# Patient Record
Sex: Male | Born: 1995 | Race: White | Hispanic: No | Marital: Married | Smoking: Never smoker
Health system: Southern US, Community
[De-identification: ages and names within clinical notes are randomized; demographics above are authoritative.]

## PROBLEM LIST (undated history)

## (undated) DIAGNOSIS — K219 Gastro-esophageal reflux disease without esophagitis: Secondary | ICD-10-CM

## (undated) HISTORY — PX: APPENDECTOMY: SHX54

## (undated) HISTORY — PX: HAND SURGERY: SHX662

## (undated) HISTORY — DX: Gastro-esophageal reflux disease without esophagitis: K21.9

---
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
## 2013-06-01 ENCOUNTER — Emergency Department (HOSPITAL_COMMUNITY): Payer: BC Managed Care – PPO

## 2013-06-01 ENCOUNTER — Encounter (HOSPITAL_COMMUNITY): Payer: Self-pay | Admitting: Emergency Medicine

## 2013-06-01 ENCOUNTER — Emergency Department (HOSPITAL_COMMUNITY)
Admission: EM | Admit: 2013-06-01 | Discharge: 2013-06-01 | Disposition: A | Discharge status: Home / Self Care | Payer: BC Managed Care – PPO | Attending: Emergency Medicine | Admitting: Emergency Medicine

## 2013-06-01 DIAGNOSIS — Z23 Encounter for immunization: Secondary | ICD-10-CM | POA: Insufficient documentation

## 2013-06-01 DIAGNOSIS — Y92410 Unspecified street and highway as the place of occurrence of the external cause: Secondary | ICD-10-CM | POA: Insufficient documentation

## 2013-06-01 DIAGNOSIS — S0990XA Unspecified injury of head, initial encounter: Secondary | ICD-10-CM

## 2013-06-01 DIAGNOSIS — S8392XA Sprain of unspecified site of left knee, initial encounter: Secondary | ICD-10-CM

## 2013-06-01 DIAGNOSIS — Y9389 Activity, other specified: Secondary | ICD-10-CM | POA: Insufficient documentation

## 2013-06-01 DIAGNOSIS — S060X9A Concussion with loss of consciousness of unspecified duration, initial encounter: Secondary | ICD-10-CM | POA: Insufficient documentation

## 2013-06-01 DIAGNOSIS — IMO0002 Reserved for concepts with insufficient information to code with codable children: Secondary | ICD-10-CM | POA: Insufficient documentation

## 2013-06-01 MED ORDER — KETOROLAC TROMETHAMINE 15 MG/ML IJ SOLN
15.0000 mg | Freq: Once | INTRAMUSCULAR | Status: AC
  Administered 2013-06-01: 15 mg via INTRAVENOUS
  Filled 2013-06-01: qty 1

## 2013-06-01 MED ORDER — ONDANSETRON HCL 4 MG/2ML IJ SOLN
4.0000 mg | Freq: Once | INTRAMUSCULAR | Status: AC
  Administered 2013-06-01: 4 mg via INTRAVENOUS
  Filled 2013-06-01: qty 2

## 2013-06-01 MED ORDER — TETANUS-DIPHTH-ACELL PERTUSSIS 5-2.5-18.5 LF-MCG/0.5 IM SUSP
0.5000 mL | Freq: Once | INTRAMUSCULAR | Status: AC
  Administered 2013-06-01: 0.5 mL via INTRAMUSCULAR
  Filled 2013-06-01: qty 0.5
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
## 2014-06-11 ENCOUNTER — Other Ambulatory Visit: Payer: Self-pay | Admitting: Occupational Medicine

## 2014-06-11 ENCOUNTER — Ambulatory Visit
Admission: RE | Admit: 2014-06-11 | Discharge: 2014-06-11 | Disposition: A | Discharge status: Home / Self Care | Payer: No Typology Code available for payment source | Source: Ambulatory Visit | Attending: Occupational Medicine | Admitting: Occupational Medicine

## 2014-06-11 DIAGNOSIS — Z021 Encounter for pre-employment examination: Secondary | ICD-10-CM

## 2018-03-08 ENCOUNTER — Emergency Department (HOSPITAL_COMMUNITY)
Admission: EM | Admit: 2018-03-08 | Discharge: 2018-03-08 | Disposition: A | Discharge status: Home / Self Care | Payer: 59 | Attending: Emergency Medicine | Admitting: Emergency Medicine

## 2018-03-08 ENCOUNTER — Emergency Department (HOSPITAL_COMMUNITY): Payer: 59

## 2018-03-08 ENCOUNTER — Other Ambulatory Visit: Payer: Self-pay

## 2018-03-08 ENCOUNTER — Encounter (HOSPITAL_COMMUNITY): Payer: Self-pay | Admitting: *Deleted

## 2018-03-08 DIAGNOSIS — W312XXA Contact with powered woodworking and forming machines, initial encounter: Secondary | ICD-10-CM | POA: Insufficient documentation

## 2018-03-08 DIAGNOSIS — Y9389 Activity, other specified: Secondary | ICD-10-CM | POA: Diagnosis not present

## 2018-03-08 DIAGNOSIS — Z23 Encounter for immunization: Secondary | ICD-10-CM | POA: Diagnosis not present

## 2018-03-08 DIAGNOSIS — Y929 Unspecified place or not applicable: Secondary | ICD-10-CM | POA: Insufficient documentation

## 2018-03-08 DIAGNOSIS — Y999 Unspecified external cause status: Secondary | ICD-10-CM | POA: Insufficient documentation

## 2018-03-08 DIAGNOSIS — S61012A Laceration without foreign body of left thumb without damage to nail, initial encounter: Secondary | ICD-10-CM | POA: Insufficient documentation

## 2018-03-08 MED ORDER — TETANUS-DIPHTH-ACELL PERTUSSIS 5-2.5-18.5 LF-MCG/0.5 IM SUSP
0.5000 mL | Freq: Once | INTRAMUSCULAR | Status: AC
  Administered 2018-03-08: 0.5 mL via INTRAMUSCULAR
  Filled 2018-03-08: qty 0.5

## 2018-03-08 MED ORDER — CEPHALEXIN 500 MG PO CAPS: 500.0000 mg | ORAL_CAPSULE | Freq: Four times a day (QID) | ORAL | 0 refills | Status: DC

## 2018-03-08 MED ORDER — ACETAMINOPHEN 325 MG PO TABS
650.0000 mg | ORAL_TABLET | Freq: Once | ORAL | Status: AC
  Administered 2018-03-08: 650 mg via ORAL
  Filled 2018-03-08: qty 2

## 2018-03-08 MED ORDER — LIDOCAINE HCL (PF) 1 % IJ SOLN
10.0000 mL | Freq: Once | INTRAMUSCULAR | Status: AC
  Administered 2018-03-08: 5 mL
  Filled 2018-03-08: qty 10

## 2018-03-08 NOTE — ED Notes (Signed)
ED Provider at bedside. 

## 2018-03-08 NOTE — ED Provider Notes (Signed)
[facility] EMERGENCY DEPARTMENT Provider Note   CSN: [id number] Arrival date & time: 03/08/18  2115     History   Chief Complaint Chief Complaint  Patient presents with  . Laceration    HPI Austin Porter is a 22 y.o. male without significant past medical hx who presents to the ED s/p L thumb laceration which occurred approximately 30 minutes PTA. Patient states he was using a table saw when he slipped and accidentally cut the palmar aspect of the L thumb. States areas is painful, rates pain a 6/10 in severity, worse with palpation. No alleviating factors. No meds PTA. No intervention PTA other than holding pressure. He is able to move the thumb. Denies numbness, tingling, weakness, or other areas of injury. Unsure of last tetanus, but thinks it was 6 years ago. Patient is R hand dominant.   HPI  History reviewed. No pertinent past medical history.  There are no active problems to display for this patient.   History reviewed. No pertinent surgical history.      Home Medications    Prior to Admission medications   Not on File    Family History No family history on file.  Social History Social History   Tobacco Use  . Smoking status: Never Smoker  Substance Use Topics  . Alcohol use: No  . Drug use: Not on file     Allergies   Patient has no known allergies.   Review of Systems Review of Systems  Constitutional: Negative for chills and fever.  Skin: Positive for wound.  Neurological: Negative for weakness and numbness.     Physical Exam Updated Vital Signs BP (!) 146/99   Pulse 87   Temp 98.3 F (36.8 C)   Resp 20   SpO2 100%   Physical Exam  Constitutional: He appears well-developed and well-nourished. No distress.  HENT:  Head: Normocephalic and atraumatic.  Eyes: Conjunctivae are normal. Right eye exhibits no discharge. Left eye exhibits no discharge.  Cardiovascular:  Pulses:      Radial pulses are 2+ on the right  side, and 2+ on the left side.  Musculoskeletal:  Full ROM to bilateral wrists and all digits including MCPs and interphalangeal joints. Tender over laceration, no other areas of tenderness.   Neurological: He is alert.  Clear speech. Sensation grossly intact to bilateral upper extremities. 5/5 symmetric grip strength. Able to perform OK sign, thumbs up, and cross 2nd/3rd digits.   Skin: Capillary refill takes less than 2 seconds.  There is a 3cm avulsion type laceration to the palmar aspect of the distal phalanx of the L thumb. There is mild active bleeding, no obvious foreign body. Does not extend past the joint line or into the nailbed.   Psychiatric: He has a normal mood and affect. His behavior is normal. Thought content normal.  Nursing note and vitals reviewed.      ED Treatments / Results  Labs (all labs ordered are listed, but only abnormal results are displayed) Labs Reviewed - No data to display  EKG None  Radiology No results found.  Procedures .Marland KitchenLaceration Repair Date/Time: 03/08/2018 11:22 PM Performed by: Cherly Anderson, PA-C Authorized by: Cherly Anderson, PA-C   Consent:    Consent obtained:  Verbal   Consent given by:  Patient   Risks discussed:  Infection, pain, retained foreign body, vascular damage, tendon damage, poor cosmetic result, poor wound healing, nerve damage and need for additional repair   Alternatives discussed:  No treatment Anesthesia (see MAR for exact dosages):    Anesthesia method:  Nerve block   Block location:  Digital, L thumb   Block needle gauge:  27 G   Block anesthetic:  Lidocaine 1% w/o epi   Block outcome:  Anesthesia achieved Laceration details:    Location:  Finger   Finger location:  L thumb   Length (cm):  3 Pre-procedure details:    Preparation:  Patient was prepped and draped in usual sterile fashion Exploration:    Hemostasis achieved with:  Direct pressure   Wound exploration: wound explored through  full range of motion and entire depth of wound probed and visualized     Contaminated: no   Treatment:    Area cleansed with:  Betadine   Amount of cleaning:  Standard   Irrigation solution:  Sterile water   Irrigation volume:  1L   Irrigation method:  Pressure wash Skin repair:    Repair method:  Sutures   Suture size:  4-0   Suture material:  Nylon   Suture technique:  Simple interrupted   Number of sutures:  6 Approximation:    Laceration repair approximation: Close in some areas, remains with avulsion centrally- not ammenable to approximation. Post-procedure details:    Dressing:  Antibiotic ointment and non-adherent dressing   Patient tolerance of procedure:  Tolerated well, no immediate complications   (including critical care time)  Medications Ordered in ED Medications  acetaminophen (TYLENOL) tablet 650 mg (650 mg Oral Given 03/08/18 2202)  Tdap (BOOSTRIX) injection 0.5 mL (0.5 mLs Intramuscular Given 03/08/18 2259)  lidocaine (PF) (XYLOCAINE) 1 % injection 10 mL (5 mLs Infiltration Given by Other 03/08/18 2301)     Initial Impression / Assessment and Plan / ED Course  I have reviewed the triage vital signs and the nursing notes.  Pertinent labs & imaging results that were available during my care of the patient were reviewed by me and considered in my medical decision making (see chart for details).   Patient presents to the emergency department with left thumb avulsion skin laceration.  X-ray obtained negative for fracture or dislocations.  Neurovascularly intact distally.  Digital nerve block performed. Pressure irrigation performed. Wound explored and base of wound visualized in a bloodless field without evidence of foreign body.  Laceration repaired per procedure note above.  Able to approximate each end of the laceration, however the central area is a tissue avulsion injury which could not be reapproximated.  A total of 6 sutures were placed.  Tdap updated.  Will place  on Keflex for infection prophylaxis.  Discussed suture home care with patient and answered questions. Patient to follow-up for wound check and suture removal in 7 days; they are to return to the ED sooner for signs of infection. I discussed results, treatment plan, need for follow-up, and return precautions with the patient. Provided opportunity for questions, patient confirmed understanding and is in agreement with plan.   Findings and plan of care discussed with supervising physician Dr. Juleen ChinaKohut who is in agreement.  Final Clinical Impressions(s) / ED Diagnoses   Final diagnoses:  Laceration of left thumb without foreign body without damage to nail, initial encounter    ED Discharge Orders        Ordered    cephALEXin (KEFLEX) 500 MG capsule  4 times daily     03/08/18 2326       Ondrea Dow, JeannetteSamantha R, PA-C 03/09/18 0144    Raeford RazorKohut, Stephen, MD 03/11/18 [phone]

## 2018-03-08 NOTE — Discharge Instructions (Addendum)
You were seen in the emergency department today for a wound to your left thumb.  6 stitches were placed into the wound.  You need to keep this area clean and dry for the next 24 hours, after 24 hours you may get the area wet but do not soak it, please avoid getting it wet as much as possible.  Please keep this area covered.  We have given you a prescription for Keflex, an antibiotic, to help prevent infection. Please take all of your antibiotics until finished. You may develop abdominal discomfort or diarrhea from the antibiotic.  You may help offset this with probiotics which you can buy at the store (ask your pharmacist if unable to find) or get probiotics in the form of eating yogurt. Do not eat or take the probiotics until 2 hours after your antibiotic. If you are unable to tolerate these side effects follow-up with your primary care provider or return to the emergency department.   If you begin to experience any blistering, rashes, swelling, or difficulty breathing seek medical care for evaluation of potentially more serious side effects.   Please be aware that this medication may interact with other medications you are taking, please be sure to discuss your medication list with your pharmacist.     The stitches will need to be removed in 7 days, you may return to the ER, go to urgent care, or see your primary care provider.  Please return to the ER sooner for new or worsening symptoms including but not limited to fever, spreading redness, drainage from the wound that is puslike, or any other concerns that you may have.

## 2018-03-08 NOTE — ED Triage Notes (Addendum)
Pt has laceration to L thumb from a table saw. Tetanus 6 years ago

## 2018-10-27 ENCOUNTER — Emergency Department (HOSPITAL_COMMUNITY)
Admission: EM | Admit: 2018-10-27 | Discharge: 2018-10-27 | Disposition: A | Discharge status: Home / Self Care | Payer: 59 | Attending: Emergency Medicine | Admitting: Emergency Medicine

## 2018-10-27 ENCOUNTER — Other Ambulatory Visit: Payer: Self-pay

## 2018-10-27 ENCOUNTER — Encounter (HOSPITAL_COMMUNITY): Payer: Self-pay

## 2018-10-27 DIAGNOSIS — R1033 Periumbilical pain: Secondary | ICD-10-CM | POA: Insufficient documentation

## 2018-10-27 DIAGNOSIS — R109 Unspecified abdominal pain: Secondary | ICD-10-CM

## 2018-10-27 LAB — COMPREHENSIVE METABOLIC PANEL
ALK PHOS: 59 U/L (ref 38–126)
ALT: 26 U/L (ref 0–44)
AST: 31 U/L (ref 15–41)
Albumin: 4.7 g/dL (ref 3.5–5.0)
Anion gap: 9 (ref 5–15)
BILIRUBIN TOTAL: 0.9 mg/dL (ref 0.3–1.2)
BUN: 14 mg/dL (ref 6–20)
CHLORIDE: 101 mmol/L (ref 98–111)
CO2: 25 mmol/L (ref 22–32)
CREATININE: 1.03 mg/dL (ref 0.61–1.24)
Calcium: 10 mg/dL (ref 8.9–10.3)
Glucose, Bld: 99 mg/dL (ref 70–99)
Potassium: 4.8 mmol/L (ref 3.5–5.1)
Sodium: 135 mmol/L (ref 135–145)
TOTAL PROTEIN: 7.9 g/dL (ref 6.5–8.1)

## 2018-10-27 LAB — URINALYSIS, ROUTINE W REFLEX MICROSCOPIC
BILIRUBIN URINE: NEGATIVE
Glucose, UA: NEGATIVE mg/dL
Hgb urine dipstick: NEGATIVE
Leukocytes,Ua: NEGATIVE
Nitrite: NEGATIVE
Protein, ur: NEGATIVE mg/dL
Specific Gravity, Urine: 1.015 (ref 1.005–1.030)
pH: 6 (ref 5.0–8.0)

## 2018-10-27 LAB — CBC WITH DIFFERENTIAL/PLATELET
Abs Immature Granulocytes: 0.05 10*3/uL (ref 0.00–0.07)
BASOS ABS: 0 10*3/uL (ref 0.0–0.1)
Basophils Relative: 0 %
Eosinophils Absolute: 0 10*3/uL (ref 0.0–0.5)
Eosinophils Relative: 0 %
HCT: 48.4 % (ref 39.0–52.0)
HEMOGLOBIN: 15.6 g/dL (ref 13.0–17.0)
Immature Granulocytes: 0 %
LYMPHS ABS: 1 10*3/uL (ref 0.7–4.0)
Lymphocytes Relative: 9 %
MCH: 26.7 pg (ref 26.0–34.0)
MCHC: 32.2 g/dL (ref 30.0–36.0)
MCV: 82.9 fL (ref 80.0–100.0)
Monocytes Absolute: 0.5 10*3/uL (ref 0.1–1.0)
Monocytes Relative: 4 %
Neutro Abs: 10.2 10*3/uL — ABNORMAL HIGH (ref 1.7–7.7)
Neutrophils Relative %: 87 %
Platelets: 233 10*3/uL (ref 150–400)
RBC: 5.84 MIL/uL — AB (ref 4.22–5.81)
RDW: 12.1 % (ref 11.5–15.5)
WBC: 11.8 10*3/uL — AB (ref 4.0–10.5)
nRBC: 0 % (ref 0.0–0.2)

## 2018-10-27 LAB — LIPASE, BLOOD: Lipase: 34 U/L (ref 11–51)

## 2018-10-27 NOTE — ED Provider Notes (Signed)
[facility] EMERGENCY DEPARTMENT Provider Note   CSN: [id number] Arrival date & time: 10/27/18  1521    History   Chief Complaint Chief Complaint  Patient presents with  . Abdominal Pain    HPI Austin Porter is a 23 y.o. male.     23 year old male with history of appendectomy who presents with abdominal pain.  This morning, he began having sharp, stabbing, intermittent periumbilical abdominal pain.  The pain is nonradiating and comes and goes randomly.  He initially went to work because he otherwise felt okay but while at work the intermittent pain continued and he eventually became doubled over in pain.  His father, with whom he works, told him to be evaluated.  Currently the pain has resolved and he feels okay.  He has been drinking fluids throughout the day but states that he has not been hungry and has not eaten anything.  He has had no associated nausea, vomiting, diarrhea, or constipation.  Last bowel movement was last night and it was normal.  He denies any testicular pain or swelling.  No fevers or URI symptoms.  He took some Rolaids this morning, no other therapies tried. No alcohol or drug use.  The history is provided by the patient.  Abdominal Pain    History reviewed. No pertinent past medical history.  There are no active problems to display for this patient.   Past Surgical History:  Procedure Laterality Date  . APPENDECTOMY          Home Medications    Prior to Admission medications   Medication Sig Start Date End Date Taking? Authorizing Provider  cephALEXin (KEFLEX) 500 MG capsule Take 1 capsule (500 mg total) by mouth 4 (four) times daily. 03/08/18   Petrucelli, Pleas Koch, PA-C    Family History History reviewed. No pertinent family history.  Social History Social History   Tobacco Use  . Smoking status: Never Smoker  . Smokeless tobacco: Never Used  Substance Use Topics  . Alcohol use: No  . Drug use: Never      Allergies   Patient has no known allergies.   Review of Systems Review of Systems  Gastrointestinal: Positive for abdominal pain.   All other systems reviewed and are negative except that which was mentioned in HPI   Physical Exam Updated Vital Signs BP 128/68   Pulse 71   Temp 98.7 F (37.1 C) (Oral)   Resp 16   Ht 5\' 11"  (1.803 m)   Wt 86.2 kg   SpO2 99%   BMI 26.50 kg/m   Physical Exam Vitals signs and nursing note reviewed.  Constitutional:      General: He is not in acute distress.    Appearance: He is well-developed.  HENT:     Head: Normocephalic and atraumatic.     Mouth/Throat:     Mouth: Mucous membranes are moist.     Pharynx: Oropharynx is clear.  Eyes:     Conjunctiva/sclera: Conjunctivae normal.     Pupils: Pupils are equal, round, and reactive to light.  Neck:     Musculoskeletal: Neck supple.  Cardiovascular:     Rate and Rhythm: Normal rate and regular rhythm.     Heart sounds: Normal heart sounds. No murmur.  Pulmonary:     Effort: Pulmonary effort is normal.     Breath sounds: Normal breath sounds.  Abdominal:     General: Bowel sounds are normal. There is no distension.  Palpations: Abdomen is soft.     Tenderness: There is abdominal tenderness in the epigastric area. Negative signs include Murphy's sign.     Comments: Very mild tenderness to deep palpation of epigastrium   Skin:    General: Skin is warm and dry.  Neurological:     Mental Status: He is alert and oriented to person, place, and time.     Comments: Fluent speech  Psychiatric:        Judgment: Judgment normal.      ED Treatments / Results  Labs (all labs ordered are listed, but only abnormal results are displayed) Labs Reviewed  CBC WITH DIFFERENTIAL/PLATELET - Abnormal; Notable for the following components:      Result Value   WBC 11.8 (*)    RBC 5.84 (*)    Neutro Abs 10.2 (*)    All other components within normal limits  URINALYSIS, ROUTINE W REFLEX  MICROSCOPIC - Abnormal; Notable for the following components:   Color, Urine YELLOW (*)    APPearance CLEAR (*)    Ketones, ur TRACE (*)    All other components within normal limits  COMPREHENSIVE METABOLIC PANEL  LIPASE, BLOOD    EKG None  Radiology No results found.  Procedures Procedures (including critical care time)  Medications Ordered in ED Medications - No data to display   Initial Impression / Assessment and Plan / ED Course  I have reviewed the triage vital signs and the nursing notes.  Pertinent labs & imaging results that were available during my care of the patient were reviewed by me and considered in my medical decision making (see chart for details).       Well-appearing on exam, normal vital signs.  Indicates that pain was central, just above his umbilicus.  Very mild tenderness in midepigastrium to deep palpation but denies any pain currently.  His lab work today is reassuring with normal UA, no evidence of blood or infection to suggest renal stones or pyelonephritis.  CMP and CBC reassuring.  With normal LFTs and lipase, I highly doubt gallbladder pathology or pancreatitis. He notes eating spicy food last night, gastritis is possible. He has tolerated PO here and has had no reoccurrence of pain.  I discussed supportive measures including over-the-counter medications to try if his symptoms return and I extensively reviewed return precautions.  He voiced understanding.  Final Clinical Impressions(s) / ED Diagnoses   Final diagnoses:  Abdominal pain, unspecified abdominal location    ED Discharge Orders    None       Little, Ambrose Finland, MD 10/27/18 1829

## 2018-10-27 NOTE — ED Notes (Signed)
Patient verbalizes understanding of discharge instructions. Opportunity for questioning and answers were provided. Armband removed by staff, pt discharged from ED.  

## 2018-10-27 NOTE — ED Notes (Signed)
Pt passed PO/fluid challenge. No complaints.

## 2018-10-27 NOTE — ED Triage Notes (Signed)
Pt from work w/ a c/o central abd pain. The pain is intermittent and a sharp/stabby pain. No N/V/D and light headedness. No LOC. Pt has had his appendix removed. Last BM was last night. No fever. No hematuria or dysuria.

## 2019-05-28 ENCOUNTER — Other Ambulatory Visit: Payer: Self-pay | Admitting: Orthopaedic Surgery

## 2019-05-28 DIAGNOSIS — M79641 Pain in right hand: Secondary | ICD-10-CM

## 2019-05-31 ENCOUNTER — Other Ambulatory Visit: Payer: 59

## 2019-09-21 ENCOUNTER — Ambulatory Visit (INDEPENDENT_AMBULATORY_CARE_PROVIDER_SITE_OTHER): Payer: 59

## 2019-09-21 ENCOUNTER — Ambulatory Visit (HOSPITAL_COMMUNITY)
Admission: EM | Admit: 2019-09-21 | Discharge: 2019-09-21 | Disposition: A | Discharge status: Home / Self Care | Payer: 59 | Attending: Emergency Medicine | Admitting: Emergency Medicine

## 2019-09-21 ENCOUNTER — Other Ambulatory Visit: Payer: Self-pay

## 2019-09-21 ENCOUNTER — Encounter (HOSPITAL_COMMUNITY): Payer: Self-pay | Admitting: *Deleted

## 2019-09-21 DIAGNOSIS — S99922A Unspecified injury of left foot, initial encounter: Secondary | ICD-10-CM

## 2019-09-21 MED ORDER — IBUPROFEN 800 MG PO TABS: 800.0000 mg | ORAL_TABLET | Freq: Three times a day (TID) | ORAL | 0 refills | Status: DC | PRN
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
## 2019-09-21 NOTE — Discharge Instructions (Signed)
Your xray is normal today which is reassuring.  You obviously injured your foot with such heavy weight to it.  Ice, elevation and ibuprofen are essential to help with pain, especially over the next 48 hours.  If significant pain swelling, numbness tingling or change in sensation please go to the ER.
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
## 2019-09-21 NOTE — ED Notes (Signed)
Ice pack provided.  Pt declines elevation of LLE due to increased pain with elevation.

## 2020-04-15 ENCOUNTER — Other Ambulatory Visit: Payer: Self-pay

## 2020-04-15 ENCOUNTER — Ambulatory Visit
Admission: RE | Admit: 2020-04-15 | Discharge: 2020-04-15 | Disposition: A | Discharge status: Home / Self Care | Payer: No Typology Code available for payment source | Source: Ambulatory Visit | Attending: Nurse Practitioner | Admitting: Nurse Practitioner

## 2020-04-15 ENCOUNTER — Other Ambulatory Visit: Payer: Self-pay | Admitting: Nurse Practitioner

## 2020-04-15 DIAGNOSIS — Z0000 Encounter for general adult medical examination without abnormal findings: Secondary | ICD-10-CM
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
## 2023-03-29 ENCOUNTER — Encounter (HOSPITAL_COMMUNITY): Payer: Self-pay

## 2023-03-29 ENCOUNTER — Ambulatory Visit (HOSPITAL_COMMUNITY)
Admission: EM | Admit: 2023-03-29 | Discharge: 2023-03-29 | Disposition: A | Discharge status: Home / Self Care | Payer: Self-pay

## 2023-03-29 ENCOUNTER — Emergency Department (HOSPITAL_COMMUNITY)
Admission: EM | Admit: 2023-03-29 | Discharge: 2023-03-29 | Disposition: A | Discharge status: Home / Self Care | Payer: No Typology Code available for payment source | Attending: Emergency Medicine | Admitting: Emergency Medicine

## 2023-03-29 ENCOUNTER — Emergency Department (HOSPITAL_COMMUNITY): Payer: No Typology Code available for payment source

## 2023-03-29 ENCOUNTER — Other Ambulatory Visit: Payer: Self-pay

## 2023-03-29 DIAGNOSIS — S59902A Unspecified injury of left elbow, initial encounter: Secondary | ICD-10-CM | POA: Diagnosis present

## 2023-03-29 DIAGNOSIS — T07XXXA Unspecified multiple injuries, initial encounter: Secondary | ICD-10-CM

## 2023-03-29 DIAGNOSIS — Y92410 Unspecified street and highway as the place of occurrence of the external cause: Secondary | ICD-10-CM | POA: Insufficient documentation

## 2023-03-29 DIAGNOSIS — S0990XA Unspecified injury of head, initial encounter: Secondary | ICD-10-CM | POA: Insufficient documentation

## 2023-03-29 DIAGNOSIS — Z23 Encounter for immunization: Secondary | ICD-10-CM | POA: Diagnosis not present

## 2023-03-29 DIAGNOSIS — S51012A Laceration without foreign body of left elbow, initial encounter: Secondary | ICD-10-CM | POA: Insufficient documentation

## 2023-03-29 MED ORDER — TETANUS-DIPHTH-ACELL PERTUSSIS 5-2.5-18.5 LF-MCG/0.5 IM SUSY
0.5000 mL | PREFILLED_SYRINGE | Freq: Once | INTRAMUSCULAR | Status: AC
  Administered 2023-03-29: 0.5 mL via INTRAMUSCULAR
  Filled 2023-03-29: qty 0.5

## 2023-03-29 MED ORDER — LIDOCAINE-EPINEPHRINE (PF) 2 %-1:200000 IJ SOLN
20.0000 mL | Freq: Once | INTRAMUSCULAR | Status: DC
  Filled 2023-03-29: qty 20

## 2023-03-29 MED ORDER — CEPHALEXIN 500 MG PO CAPS: 500.0000 mg | ORAL_CAPSULE | Freq: Four times a day (QID) | ORAL | 0 refills | Status: DC

## 2023-03-29 MED ORDER — NAPROXEN 375 MG PO TABS: 375.0000 mg | ORAL_TABLET | Freq: Two times a day (BID) | ORAL | 0 refills | Status: DC
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
## 2023-03-29 NOTE — ED Notes (Signed)
Patient transported to CT 
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
## 2023-03-29 NOTE — ED Notes (Signed)
Patient ambulated to bathroom.
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
## 2023-03-29 NOTE — Discharge Instructions (Signed)
Take the antibiotics as prescribed.  Monitor for signs of infection.  Return to the ED for redness drainage fever or other concerning symptoms.  Suture removal in approximately 10 days.
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
## 2023-03-29 NOTE — ED Provider Notes (Signed)
[place] EMERGENCY DEPARTMENT AT [facility] Provider Note .Marland KitchenLaceration Repair  Date/Time: 03/29/2023 9:41 PM  Performed by: Arabella Merles, PA-C Authorized by: Arabella Merles, PA-C   Consent:    Consent obtained:  Verbal   Consent given by:  Patient   Risks, benefits, and alternatives were discussed: yes     Risks discussed:  Infection, pain and need for additional repair   Alternatives discussed:  No treatment Universal protocol:    Procedure explained and questions answered to patient or proxy's satisfaction: yes     Relevant documents present and verified: yes     Test results available: yes     Imaging studies available: yes     Patient identity confirmed:  Verbally with patient Anesthesia:    Anesthesia method:  Local infiltration   Local anesthetic:  Lidocaine 2% WITH epi (10) Laceration details:    Location: left elbow.   Length (cm):  4 Pre-procedure details:    Preparation:  Patient was prepped and draped in usual sterile fashion and imaging obtained to evaluate for foreign bodies Exploration:    Hemostasis achieved with:  Epinephrine   Imaging outcome: foreign body not noted     Wound exploration: wound explored through full range of motion     Contaminated: yes   Treatment:    Area cleansed with:  Povidone-iodine   Amount of cleaning:  Extensive   Irrigation solution:  Sterile water   Irrigation volume:  2L   Irrigation method:  Syringe   Visualized foreign bodies/material removed: yes     Debridement:  Moderate Skin repair:    Repair method:  Sutures   Suture size:  4-0   Suture material:  Prolene   Suture technique:  Simple interrupted and vertical mattress   Number of sutures:  6 Repair type:    Repair type:  Complex Post-procedure details:    Dressing:  Antibiotic ointment and sterile dressing   Procedure completion:  Tolerated well, no immediate complications .Marland KitchenLaceration Repair  Date/Time: 03/29/2023 9:46 PM  Performed by:  Arabella Merles, PA-C Authorized by: Arabella Merles, PA-C   Consent:    Consent obtained:  Verbal   Consent given by:  Patient   Risks, benefits, and alternatives were discussed: yes     Risks discussed:  Infection, need for additional repair, pain and poor cosmetic result   Alternatives discussed:  No treatment Universal protocol:    Procedure explained and questions answered to patient or proxy's satisfaction: yes     Relevant documents present and verified: yes     Test results available: yes     Imaging studies available: yes     Patient identity confirmed:  Verbally with patient Anesthesia:    Anesthesia method:  Local infiltration   Local anesthetic:  Lidocaine 2% WITH epi (5ml) Laceration details:    Location: left elbow.   Length (cm):  5 Pre-procedure details:    Preparation:  Patient was prepped and draped in usual sterile fashion and imaging obtained to evaluate for foreign bodies Exploration:    Hemostasis achieved with:  Epinephrine Treatment:    Area cleansed with:  Povidone-iodine   Amount of cleaning:  Standard   Irrigation solution:  Sterile water   Irrigation volume:  1L   Irrigation method:  Syringe   Debridement:  Minimal Skin repair:    Repair method:  Sutures   Suture size:  4-0   Suture material:  Prolene   Suture technique:  Simple interrupted   Number  of sutures:  4 Repair type:    Repair type:  Simple Post-procedure details:    Dressing:  Sterile dressing and antibiotic ointment   Procedure completion:  Tolerated well, no immediate complications       Arabella Merles, PA-C 03/29/23 2149    Linwood Dibbles, MD 03/30/23 [phone]

## 2024-02-28 ENCOUNTER — Ambulatory Visit (INDEPENDENT_AMBULATORY_CARE_PROVIDER_SITE_OTHER): Payer: Self-pay | Admitting: Family Medicine

## 2024-02-28 ENCOUNTER — Encounter: Payer: Self-pay | Admitting: Family Medicine

## 2024-02-28 VITALS — BP 120/78 | HR 62 | Temp 98.0°F | Ht 72.0 in | Wt 187.0 lb

## 2024-02-28 DIAGNOSIS — Z114 Encounter for screening for human immunodeficiency virus [HIV]: Secondary | ICD-10-CM

## 2024-02-28 DIAGNOSIS — E663 Overweight: Secondary | ICD-10-CM

## 2024-02-28 DIAGNOSIS — Z1159 Encounter for screening for other viral diseases: Secondary | ICD-10-CM

## 2024-02-28 DIAGNOSIS — K2100 Gastro-esophageal reflux disease with esophagitis, without bleeding: Secondary | ICD-10-CM

## 2024-02-28 DIAGNOSIS — Z7689 Persons encountering health services in other specified circumstances: Secondary | ICD-10-CM

## 2024-02-28 LAB — LIPID PANEL
Cholesterol: 162 mg/dL (ref 0–200)
HDL: 39.4 mg/dL (ref >39.00)
LDL Cholesterol: 110 mg/dL — ABNORMAL HIGH (ref 0–99)
NonHDL: 122.57
Total CHOL/HDL Ratio: 4
Triglycerides: 61 mg/dL (ref 0.0–149.0)
VLDL: 12.2 mg/dL (ref 0.0–40.0)

## 2024-02-28 LAB — COMPREHENSIVE METABOLIC PANEL WITH GFR
ALT: 18 U/L (ref 0–53)
AST: 18 U/L (ref 0–37)
Albumin: 4.6 g/dL (ref 3.5–5.2)
Alkaline Phosphatase: 48 U/L (ref 39–117)
BUN: 17 mg/dL (ref 6–23)
CO2: 29 meq/L (ref 19–32)
Calcium: 9.5 mg/dL (ref 8.4–10.5)
Chloride: 104 meq/L (ref 96–112)
Creatinine, Ser: 1.08 mg/dL (ref 0.40–1.50)
GFR: 92.26 mL/min (ref >60.00)
Glucose, Bld: 94 mg/dL (ref 70–99)
Potassium: 4.3 meq/L (ref 3.5–5.1)
Sodium: 139 meq/L (ref 135–145)
Total Bilirubin: 0.4 mg/dL (ref 0.2–1.2)
Total Protein: 7.3 g/dL (ref 6.0–8.3)

## 2024-02-28 LAB — TSH: TSH: 1.25 u[IU]/mL (ref 0.35–5.50)

## 2024-02-28 LAB — HEMOGLOBIN A1C: Hgb A1c MFr Bld: 5.5 % (ref 4.6–6.5)

## 2024-02-28 NOTE — Progress Notes (Signed)
 New Patient Office Visit  Subjective   Patient ID: Austin Porter, male    DOB: 1996-04-19  Age: 28 y.o. MRN: [id number]  CC:  Chief Complaint  Patient presents with   Establish Care    HPI [name] presents to establish care with new provider.  Patients previous primary care provider: None  Specialist: None   Patient is concerned about GERD symptoms. Initially have GERD episodes about once a week for a year in the last 3 years. He would take Rolands with relief. About 2 months ago, he started having GERD symptoms consistently, daily. He reports he can remember going to a wedding, drinking a few beers, afterwards started noticing the consistent heartburn. He reports he has random heartburn, varies, not related to when he eats. He was seen at [facility] on 01/29/2024 for epigastric pain. He was prescribed Pepcid 140mg  for 20 days and took Maalox as needed, about 4 doses.   Since ED visit, he has tried to change his diet by eliminating sodas, drinking more water, sweet tea, and flavored water. He stopped drinking [facility], which made symptoms worse. Trying to limit onions, tomatoes, and hot things. He reports the Pepcid helped some. He has finished the course of medication, not had symptoms in the last 3 days. He does have OTC Pepcid.    Outpatient Encounter Medications as of 02/28/2024  Medication Sig   [DISCONTINUED] cephALEXin  (KEFLEX ) 500 MG capsule Take 1 capsule (500 mg total) by mouth 4 (four) times daily.   [DISCONTINUED] naproxen  (NAPROSYN ) 375 MG tablet Take 1 tablet (375 mg total) by mouth 2 (two) times daily.   No facility-administered encounter medications on file as of 02/28/2024.    Past Medical History:  Diagnosis Date   GERD (gastroesophageal reflux disease)     Past Surgical History:  Procedure Laterality Date   APPENDECTOMY     HAND SURGERY Right     Family History  Problem Relation Age of Onset   Cancer Mother         Breast   Alcohol abuse Father     Social History   Socioeconomic History   Marital status: Married    Spouse name: Not on file   Number of children: 0   Years of education: Not on file   Highest education level: High school graduate  Occupational History   Not on file  Tobacco Use   Smoking status: Never   Smokeless tobacco: Never  Vaping Use   Vaping status: Never Used  Substance and Sexual Activity   Alcohol use: No   Drug use: Never   Sexual activity: Yes    Birth control/protection: None  Other Topics Concern   Not on file  Social History Narrative   Not on file   Social Drivers of Health   Financial Resource Strain: Low Risk  (02/28/2024)   Overall Financial Resource Strain (CARDIA)    Difficulty of Paying Living Expenses: Not hard at all  Food Insecurity: No Food Insecurity (02/28/2024)   Hunger Vital Sign    Worried About Running Out of Food in the Last Year: Never true    Ran Out of Food in the Last Year: Never true  Transportation Needs: No Transportation Needs (02/28/2024)   PRAPARE - Administrator, Civil Service (Medical): No    Lack of Transportation (Non-Medical): No  Physical Activity: Sufficiently Active (02/28/2024)   Exercise Vital Sign    Days of Exercise  per Week: 5 days    Minutes of Exercise per Session: 30 min  Stress: No Stress Concern Present (02/28/2024)   [employer] of Occupational Health - Occupational Stress Questionnaire    Feeling of Stress: Not at all  Social Connections: Moderately Integrated (02/28/2024)   Social Connection and Isolation Panel    Frequency of Communication with Friends and Family: More than three times a week    Frequency of Social Gatherings with Friends and Family: Once a week    Attends Religious Services: More than 4 times per year    Active Member of [employer] or Organizations: No    Attends Banker Meetings: Never    Marital Status: Married  Catering manager Violence: Not At Risk  (02/28/2024)   Humiliation, Afraid, Rape, and Kick questionnaire    Fear of Current or Ex-Partner: No    Emotionally Abused: No    Physically Abused: No    Sexually Abused: No    ROS See HPI above    Objective  BP 120/78   Pulse 62   Temp 98 F (36.7 C) (Oral)   Ht 6' (1.829 m)   Wt 187 lb (84.8 kg)   SpO2 98%   BMI 25.36 kg/m   Physical Exam Vitals reviewed.  Constitutional:      General: He is not in acute distress.    Appearance: Normal appearance. He is overweight. He is not ill-appearing, toxic-appearing or diaphoretic.  HENT:     Head: Normocephalic and atraumatic.  Eyes:     General:        Right eye: No discharge.        Left eye: No discharge.     Conjunctiva/sclera: Conjunctivae normal.  Cardiovascular:     Rate and Rhythm: Normal rate and regular rhythm.     Heart sounds: Normal heart sounds. No murmur heard.    No friction rub. No gallop.  Pulmonary:     Effort: Pulmonary effort is normal. No respiratory distress.     Breath sounds: Normal breath sounds.  Abdominal:     General: Bowel sounds are increased. There is no distension.     Palpations: There is no mass.  Musculoskeletal:        General: Normal range of motion.  Skin:    General: Skin is warm and dry.  Neurological:     General: No focal deficit present.     Mental Status: He is alert and oriented to person, place, and time. Mental status is at baseline.  Psychiatric:        Mood and Affect: Mood normal.        Behavior: Behavior normal.        Thought Content: Thought content normal.        Judgment: Judgment normal.      Assessment & Plan:  Gastroesophageal reflux disease with esophagitis without hemorrhage -     Ambulatory referral to Gastroenterology  Overweight -     Comprehensive metabolic panel with GFR -     Hemoglobin A1c -     Lipid panel -     TSH  Need for hepatitis C screening test -     Hepatitis C antibody  Encounter for screening for HIV -     HIV Antibody  (routine testing w rflx)  Encounter to establish care   1.Review health maintenance:  -Covid booster: Declines  -Hep B vaccine: Unknown  -Hep C and HIV screening: Ordered  -HPV vaccine: Not had  2.Ordered labs (CMP, A1c, Lipid panel-fasting, and TSH) based on BMI-overweight. CBC was normal in June 2025 when he went to ED. 3.Referral placed to GI for GERD symptoms. Please call the office or send a MyChart message if you do not receive a phone call or a MyChart message about appointment in 2 weeks.  4.May take over the counter Pepcid if symptoms return.  Return in about 1 year (around 02/27/2025) for physical.   Smayan Hackbart, NP

## 2024-02-28 NOTE — Patient Instructions (Addendum)
-  It was nice to meet you and look forward to taking care of you. -Ordered labs. Office will call with results and will be available on MyChart. -Referral placed to GI for GERD symptoms. Please call the office or send a MyChart message if you do not receive a phone call or a MyChart message about appointment in 2 weeks.  -May take over the counter Pepcid if symptoms return.  -Follow up in 1 year for a physical.

## 2024-02-29 ENCOUNTER — Ambulatory Visit: Payer: Self-pay | Admitting: Family Medicine

## 2024-02-29 LAB — HIV ANTIBODY (ROUTINE TESTING W REFLEX): HIV 1&2 Ab, 4th Generation: NONREACTIVE

## 2024-02-29 LAB — HEPATITIS C ANTIBODY: Hepatitis C Ab: NONREACTIVE

## 2025-02-28 ENCOUNTER — Encounter: Admitting: Family Medicine
